# Patient Record
Sex: Male | Born: 1980 | Race: Black or African American | Hispanic: No | Marital: Married | Smoking: Current every day smoker
Health system: Southern US, Community
[De-identification: ages and names within clinical notes are randomized; demographics above are authoritative.]

---

## 2016-11-17 ENCOUNTER — Encounter (HOSPITAL_BASED_OUTPATIENT_CLINIC_OR_DEPARTMENT_OTHER): Payer: Self-pay | Admitting: *Deleted

## 2016-11-17 ENCOUNTER — Emergency Department (HOSPITAL_BASED_OUTPATIENT_CLINIC_OR_DEPARTMENT_OTHER)
Admission: EM | Admit: 2016-11-17 | Discharge: 2016-11-17 | Disposition: A | Discharge status: Home / Self Care | Payer: Self-pay | Attending: Emergency Medicine | Admitting: Emergency Medicine

## 2016-11-17 DIAGNOSIS — F17200 Nicotine dependence, unspecified, uncomplicated: Secondary | ICD-10-CM | POA: Insufficient documentation

## 2016-11-17 DIAGNOSIS — L0201 Cutaneous abscess of face: Secondary | ICD-10-CM | POA: Insufficient documentation

## 2016-11-17 MED ORDER — SULFAMETHOXAZOLE-TRIMETHOPRIM 800-160 MG PO TABS: 1.0000 | ORAL_TABLET | Freq: Two times a day (BID) | ORAL | 0 refills | Status: AC

## 2016-11-17 MED ORDER — SULFAMETHOXAZOLE-TRIMETHOPRIM 800-160 MG PO TABS
1.0000 | ORAL_TABLET | Freq: Once | ORAL | Status: AC
  Administered 2016-11-17: 1 via ORAL
  Filled 2016-11-17: qty 1

## 2016-11-17 MED ORDER — LIDOCAINE-EPINEPHRINE (PF) 2 %-1:200000 IJ SOLN
10.0000 mL | Freq: Once | INTRAMUSCULAR | Status: AC
  Administered 2016-11-17: 10 mL via INTRADERMAL
  Filled 2016-11-17: qty 10

## 2016-11-17 NOTE — ED Triage Notes (Signed)
Patient states he had a pimple over his left eyebrow and last night he attempted to squeeze it.  States he could not get the white head out so he placed a safety pin tip into the pimple.  Woke up this morning with redness and swelling around his left orbit, eye lid and forehead.

## 2016-11-17 NOTE — ED Provider Notes (Signed)
[facility] Provider Note   CSN: [id number] Arrival date & time: 11/17/16  1427   By signing my name below, I, Johnny Rivers, attest that this documentation has been prepared under the direction and in the presence of Johnny Russo, PA-C. Electronically Signed: Cynda AcresHailei Rivers, Scribe. 11/17/16. 5:18 PM.   History   Chief Complaint Chief Complaint  Patient presents with  . Facial Swelling    left   HPI Comments: Johnny Rivers is a 36 y.o. male with no significant past medical history, who presents to the Emergency Department complaining of sudden-onset, constant left eye swelling that began this morning. Patient reports having a "pimple" over his left eyebrow, Patient attempted to pop the pimple yesterday evening. Patient reports placing a safety pin tip into the pimple, causing the area to begin bleeding. Patient states this morning he woke up with his left eye and left eyebrow swollen. Patient reports associated left eyebrow pain. Patient reports taking benadryl and applying ice with no relief. Patient is a current everyday smoker. Patient denies any fever, chills, nausea, vomiting, sore throat, trouble swallowing, or lip or tongue swelling.   The history is provided by the patient. No language interpreter was used.    History reviewed. No pertinent past medical history.  There are no active problems to display for this patient.   History reviewed. No pertinent surgical history.     Home Medications    Prior to Admission medications   Medication Sig Start Date End Date Taking? Authorizing Provider  sulfamethoxazole-trimethoprim (BACTRIM DS,SEPTRA DS) 800-160 MG tablet Take 1 tablet by mouth 2 (two) times daily. 11/17/16 11/24/16  Rivers, Johnny N, PA-C    Family History No family history on file.  Social History Social History  Substance Use Topics  . Smoking status: Current Every Day Smoker  . Smokeless tobacco: Never Used  . Alcohol use No      Allergies   Patient has no known allergies.   Review of Systems Review of Systems  Constitutional: Negative for chills and fatigue.  HENT: Positive for facial swelling (left eye). Negative for sore throat and trouble swallowing.   Eyes: Negative for pain and visual disturbance.  Gastrointestinal: Negative for nausea and vomiting.     Physical Exam Updated Vital Signs BP (!) 134/92 (BP Location: Right Arm)   Pulse 78   Temp 98.7 F (37.1 C) (Oral)   Resp 18   Ht 6' (1.829 m)   Wt 160 lb (72.6 kg)   SpO2 100%   BMI 21.70 kg/m   Physical Exam  Constitutional: He appears well-developed and well-nourished. No distress.  HENT:  Head: Normocephalic and atraumatic.  Eyes: Conjunctivae and EOM are normal. Pupils are equal, round, and reactive to light.  Left upper eyelid with edema. Visual acuity normal.  Neck: Normal range of motion. Neck supple.  Cardiovascular: Normal rate.   Pulmonary/Chest: Effort normal. No stridor. No respiratory distress.  Lymphadenopathy:    He has no cervical adenopathy.  Skin:  Left lateral brow with small mildly fluctuant erythematous abscess with scab. No significant surrounding cellulitis.   Psychiatric: He has a normal mood and affect. His behavior is normal.  Nursing note and vitals reviewed.    ED Treatments / Results  DIAGNOSTIC STUDIES: Oxygen Saturation is 100% on RA, normal by my interpretation.    COORDINATION OF CARE: 5:15 PM Discussed treatment plan with pt at bedside and pt agreed to plan, which includes imaging of the eye.   Labs (all labs  ordered are listed, but only abnormal results are displayed) Labs Reviewed - No data to display  EKG  EKG Interpretation None       Radiology No results found.  Procedures .Marland KitchenIncision and Drainage Date/Time: 11/17/2016 5:42 PM Performed by: Rivers, [place] N Authorized by: Rivers, [place] N   Consent:    Consent obtained:  Verbal   Consent given by:  Patient   Risks  discussed:  Bleeding, incomplete drainage, infection, pain and damage to other organs   Alternatives discussed:  No treatment Location:    Type:  Abscess   Size:  ~1cm   Location:  Head   Head location:  Face Pre-procedure details:    Skin preparation:  Betadine Anesthesia (see MAR for exact dosages):    Anesthesia method:  Local infiltration   Local anesthetic:  Lidocaine 2% WITH epi Procedure type:    Complexity:  Simple Procedure details:    Needle aspiration: yes     Needle gauge: 27G.   Incision types:  Stab incision   Incision depth:  Dermal   Scalpel blade:  15   Wound management:  Probed and deloculated and irrigated with saline   Drainage:  Purulent and bloody   Drainage amount:  Moderate   Wound treatment:  Wound left open   Packing materials:  None Post-procedure details:    Patient tolerance of procedure:  Tolerated well, no immediate complications   EMERGENCY DEPARTMENT US SOFT TISSUE INTERPRETATION "Study: Limited Soft Tissue Ultrasound"  INDICATIONS: Soft tissue infection Multiple views of the body part were obtained in real-time with a multi-frequency linear probe  PERFORMED BY: Myself IMAGES ARCHIVED?: Yes SIDE:Left BODY PART:Left eyebrow INTERPRETATION:  Abcess present and No cellulitis noted     Medications Ordered in ED Medications  lidocaine-EPINEPHrine (XYLOCAINE W/EPI) 2 %-1:200000 (PF) injection 10 mL (not administered)     Initial Impression / Assessment and Plan / ED Course  I have reviewed the triage vital signs and the nursing notes.  Pertinent labs & imaging results that were available during my care of the patient were reviewed by me and considered in my medical decision making (see chart for details).     Patient with skin abscess left eyebrow, confirmed on U/S. Pt given option to drain it in ED or for conservative therapy with warm compresses and abx; pt elected I&D today.  Abscess was not large enough to warrant packing or  drain,  wound recheck in 2 days. No hx of immunocompromise. Pt is afebrile. Encouraged home warm soaks and flushing.  Mild signs of cellulitis in surrounding skin.  Will d/c to home with Bactrim. Pt safe for discharge.  Patient discussed with Dr. Rush Landmark Discussed results, findings, treatment and follow up. Patient advised of return precautions. Patient verbalized understanding and agreed with plan.    Final Clinical Impressions(s) / ED Diagnoses   Final diagnoses:  Cutaneous abscess of face    New Prescriptions New Prescriptions   SULFAMETHOXAZOLE-TRIMETHOPRIM (BACTRIM DS,SEPTRA DS) 800-160 MG TABLET    Take 1 tablet by mouth 2 (two) times daily.    I personally performed the services described in this documentation, which was scribed in my presence. The recorded information has been reviewed and is accurate.     Rivers, [place] N, PA-C 11/17/16 1836    Tegeler, Canary Brim, MD 11/18/16 [phone]

## 2016-11-17 NOTE — ED Notes (Signed)
ED Provider at bedside. 

## 2016-11-17 NOTE — Discharge Instructions (Signed)
Please read instructions below. Keep your wound clean and dry. Avoid touching it with your fingers. Take Bactrim, the antibiotic, 2 times per day until it's gone. You can take advil or tylenol as needed for pain. [facility], or this ER for a wound recheck in 2 days. Return to the ER for fever, or new or worsening symptoms.

## 2016-11-17 NOTE — ED Notes (Signed)
ED Provider at bedside for plan of care.

## 2020-06-14 ENCOUNTER — Encounter (HOSPITAL_BASED_OUTPATIENT_CLINIC_OR_DEPARTMENT_OTHER): Payer: Self-pay | Admitting: Emergency Medicine

## 2020-06-14 ENCOUNTER — Emergency Department (HOSPITAL_BASED_OUTPATIENT_CLINIC_OR_DEPARTMENT_OTHER)
Admission: EM | Admit: 2020-06-14 | Discharge: 2020-06-14 | Disposition: A | Discharge status: Home / Self Care | Payer: BLUE CROSS/BLUE SHIELD | Attending: Emergency Medicine | Admitting: Emergency Medicine

## 2020-06-14 ENCOUNTER — Other Ambulatory Visit: Payer: Self-pay

## 2020-06-14 DIAGNOSIS — F17200 Nicotine dependence, unspecified, uncomplicated: Secondary | ICD-10-CM | POA: Insufficient documentation

## 2020-06-14 DIAGNOSIS — R519 Headache, unspecified: Secondary | ICD-10-CM | POA: Insufficient documentation

## 2020-06-14 DIAGNOSIS — H53149 Visual discomfort, unspecified: Secondary | ICD-10-CM | POA: Diagnosis not present

## 2020-06-14 DIAGNOSIS — Z20822 Contact with and (suspected) exposure to covid-19: Secondary | ICD-10-CM | POA: Diagnosis not present

## 2020-06-14 LAB — RESP PANEL BY RT-PCR (FLU A&B, COVID) ARPGX2
Influenza A by PCR: NEGATIVE
Influenza B by PCR: NEGATIVE
SARS Coronavirus 2 by RT PCR: NEGATIVE

## 2020-06-14 MED ORDER — METOCLOPRAMIDE HCL 5 MG/ML IJ SOLN
10.0000 mg | Freq: Once | INTRAMUSCULAR | Status: AC
  Administered 2020-06-14: 16:00:00 10 mg via INTRAVENOUS
  Filled 2020-06-14: qty 2

## 2020-06-14 MED ORDER — KETOROLAC TROMETHAMINE 15 MG/ML IJ SOLN
15.0000 mg | Freq: Once | INTRAMUSCULAR | Status: AC
  Administered 2020-06-14: 16:00:00 15 mg via INTRAVENOUS
  Filled 2020-06-14: qty 1

## 2020-06-14 MED ORDER — SODIUM CHLORIDE 0.9 % IV BOLUS
500.0000 mL | Freq: Once | INTRAVENOUS | Status: AC
  Administered 2020-06-14: 16:00:00 500 mL via INTRAVENOUS

## 2020-06-14 NOTE — ED Provider Notes (Signed)
[facility] EMERGENCY DEPARTMENT Provider Note   CSN: [id number] Arrival date & time: 06/14/20  1358     History Chief Complaint  Patient presents with  . Headache    Johnny Rivers is a 39 y.o. male.  HPI   Patient with no significant medical history presents to the emergency department with chief complaint of headache.  Patient states headache started yesterday and has continued.  He states he has pain bilaterally on his forehead, describes as a consistent pain with sensitivity to noise and photophobia.  He denies change in vision, paresthesias or weakness in the upper or lower extremities, nausea, vomiting, recent head trauma and is not on anticoagulant.  He states he is fully up-to-date on his childhood vaccines, has no torticollis, denies fevers, chills, nasal congestion, sore throat, dental pain, general body aches.  He is currently not vaccine against COVID-19, denies recent sick contacts, no recent travels.  Does endorse that he recently got fitted for glasses and is unsure if he has to wear them consistently.  He has been taking ibuprofen without any relief has no history of migraines.  Patient denies chest pain, shortness of breath, abdominal pain, nausea, vomiting, diarrhea, pedal edema.  History reviewed. No pertinent past medical history.  There are no problems to display for this patient.   History reviewed. No pertinent surgical history.     No family history on file.  Social History   Tobacco Use  . Smoking status: Current Every Day Smoker  . Smokeless tobacco: Never Used  Substance Use Topics  . Alcohol use: No  . Drug use: No    Home Medications Prior to Admission medications   Not on File    Allergies    Patient has no known allergies.  Review of Systems   Review of Systems  Constitutional: Negative for chills and fever.  HENT: Negative for congestion and sore throat.   Eyes: Negative for visual disturbance.  Respiratory: Negative  for shortness of breath.   Cardiovascular: Negative for chest pain.  Gastrointestinal: Negative for abdominal pain, diarrhea, nausea and vomiting.  Genitourinary: Negative for enuresis.  Musculoskeletal: Negative for back pain.  Skin: Negative for rash.  Neurological: Positive for headaches. Negative for dizziness.  Hematological: Does not bruise/bleed easily.    Physical Exam Updated Vital Signs BP 94/60 (BP Location: Left Arm)   Pulse 78   Temp 99 F (37.2 C) (Oral)   Resp 16   Ht 5\' 11"  (1.803 m)   Wt 78 kg   SpO2 100%   BMI 23.99 kg/m   Physical Exam Vitals and nursing note reviewed.  Constitutional:      General: He is not in acute distress.    Appearance: Normal appearance. He is not ill-appearing or diaphoretic.  HENT:     Head: Normocephalic and atraumatic.     Right Ear: Tympanic membrane, ear canal and external ear normal.     Left Ear: Tympanic membrane, ear canal and external ear normal.     Nose: No congestion or rhinorrhea.     Comments: No nasal congestion, turbinates were nonerythematous    Mouth/Throat:     Mouth: Mucous membranes are moist.     Pharynx: Oropharynx is clear. No oropharyngeal exudate or posterior oropharyngeal erythema.     Comments: Oropharynx is visualized tongue and uvula are both midline, no dental cavities noted. Eyes:     General: No visual field deficit or scleral icterus.    Extraocular Movements: Extraocular movements intact.  Conjunctiva/sclera: Conjunctivae normal.     Pupils: Pupils are equal, round, and reactive to light.  Cardiovascular:     Rate and Rhythm: Normal rate and regular rhythm.     Pulses: Normal pulses.     Heart sounds: No murmur heard. No friction rub. No gallop.   Pulmonary:     Effort: Pulmonary effort is normal. No respiratory distress.     Breath sounds: No wheezing, rhonchi or rales.  Abdominal:     Palpations: Abdomen is soft.     Tenderness: There is no abdominal tenderness.  Musculoskeletal:         General: No swelling or tenderness.  Skin:    General: Skin is warm and dry.  Neurological:     General: No focal deficit present.     Mental Status: He is alert.     GCS: GCS eye subscore is 4. GCS verbal subscore is 5. GCS motor subscore is 6.     Cranial Nerves: Cranial nerves are intact. No cranial nerve deficit or facial asymmetry.     Sensory: Sensation is intact. No sensory deficit.     Motor: Motor function is intact. No weakness or pronator drift.     Coordination: Coordination is intact. Romberg sign negative. Finger-Nose-Finger Test normal.  Psychiatric:        Mood and Affect: Mood normal.     ED Results / Procedures / Treatments   Labs (all labs ordered are listed, but only abnormal results are displayed) Labs Reviewed  RESP PANEL BY RT-PCR (FLU A&B, COVID) ARPGX2    EKG None  Radiology No results found.  Procedures Procedures (including critical care time)  Medications Ordered in ED Medications  ketorolac (TORADOL) 15 MG/ML injection 15 mg (15 mg Intravenous Given 06/14/20 1611)  metoCLOPramide (REGLAN) injection 10 mg (10 mg Intravenous Given 06/14/20 1612)  sodium chloride 0.9 % bolus 500 mL (0 mLs Intravenous Stopped 06/14/20 1701)    ED Course  I have reviewed the triage vital signs and the nursing notes.  Pertinent labs & imaging results that were available during my care of the patient were reviewed by me and considered in my medical decision making (see chart for details).    MDM Rules/Calculators/A&P                          Patient presents with headache.  He is alert, does not appear in acute distress, vital signs reassuring.  Due to well-appearing patient, benign physical exam further lab work and imaging not warranted at this time.  Will provide patient with a migraine cocktail reevaluate.  Patient was reassessed at providing cocktail and he is feeling much better, states his headache is much more manageable, has no other complaints  at this time.  Respiratory panel negative for Covid, influenza A/B.  Low suspicion for intracranial head bleed or mass as there is no neuro deficits on my exam, patient denies recent head trauma, is not on anticoagulant.  Headache improved with migraine cocktail.  Low suspicion for dental abnormality causing headaches as oropharynx was visualized no cavities noted.  Patient had negative meningeal sign. low suspicion for systemic infection as patient is nontoxic-appearing, vital signs reassuring, no obvious source infection noted on exam.  Low suspicion for pneumonia as lung sounds are clear bilaterally, will defer imaging at this time. low suspicion for strep throat as oropharynx was visualized, no erythema or exudates noted.  Low suspicion patient would need  hospitalized  due to viral infection or Covid as vital signs reassuring, patient is not in respiratory distress.  Patient has a headache unknown etiology differential diagnosis includes migraine versus URI versus dehydration versus stress.  Will recommend over-the-counter pain medications follow-up with PCP for further evaluation.  Vital signs have remained stable, no indication for hospital admission.Patient given at home care as well strict return precautions.  Patient verbalized that they understood agreed to said plan.   Final Clinical Impression(s) / ED Diagnoses Final diagnoses:  Bad headache    Rx / DC Orders ED Discharge Orders    None       Carroll Sage, PA-C 06/14/20 1809    Vanetta Mulders, MD 06/19/20 [phone]

## 2020-06-14 NOTE — Discharge Instructions (Signed)
Seen here for a headache.  Exam looks reassuring.  I recommend over-the-counter pain medications like ibuprofen and/or Tylenol every 6 hours as needed please follow dosage and on the back of bottle.  You may also take Excedrin and Benadryl as this can help with migraines as well.  Your Covid test is pending at this time, you should see results on MyChart.   Recommend following up with your PCP for further evaluation.  Come back to the emergency department if you develop chest pain, shortness of breath, severe abdominal pain, uncontrolled nausea, vomiting, diarrhea.

## 2020-06-14 NOTE — ED Triage Notes (Addendum)
Frontal headache x 2 days. Pt recently got new glasses but is unsure if he is supposed to wear them all of the time.

## 2020-07-01 ENCOUNTER — Encounter (HOSPITAL_BASED_OUTPATIENT_CLINIC_OR_DEPARTMENT_OTHER): Payer: Self-pay

## 2020-07-01 ENCOUNTER — Emergency Department (HOSPITAL_BASED_OUTPATIENT_CLINIC_OR_DEPARTMENT_OTHER)
Admission: EM | Admit: 2020-07-01 | Discharge: 2020-07-01 | Disposition: A | Discharge status: Home / Self Care | Payer: BLUE CROSS/BLUE SHIELD | Attending: Emergency Medicine | Admitting: Emergency Medicine

## 2020-07-01 ENCOUNTER — Emergency Department (HOSPITAL_BASED_OUTPATIENT_CLINIC_OR_DEPARTMENT_OTHER)
Admission: EM | Admit: 2020-07-01 | Discharge: 2020-07-01 | Disposition: A | Discharge status: Home / Self Care | Payer: BLUE CROSS/BLUE SHIELD

## 2020-07-01 ENCOUNTER — Other Ambulatory Visit: Payer: Self-pay

## 2020-07-01 DIAGNOSIS — Z5321 Procedure and treatment not carried out due to patient leaving prior to being seen by health care provider: Secondary | ICD-10-CM | POA: Diagnosis not present

## 2020-07-01 DIAGNOSIS — R300 Dysuria: Secondary | ICD-10-CM | POA: Insufficient documentation

## 2020-07-01 DIAGNOSIS — R369 Urethral discharge, unspecified: Secondary | ICD-10-CM | POA: Insufficient documentation

## 2020-07-01 LAB — URINALYSIS, ROUTINE W REFLEX MICROSCOPIC
Bilirubin Urine: NEGATIVE
Glucose, UA: NEGATIVE mg/dL
Ketones, ur: NEGATIVE mg/dL
Nitrite: NEGATIVE
Protein, ur: NEGATIVE mg/dL
Specific Gravity, Urine: 1.02 (ref 1.005–1.030)
pH: 6.5 (ref 5.0–8.0)

## 2020-07-01 LAB — URINALYSIS, MICROSCOPIC (REFLEX): WBC, UA: >50 WBC/hpf (ref 0–5)

## 2020-07-01 NOTE — ED Notes (Signed)
Pt left per  screener

## 2020-07-01 NOTE — ED Notes (Signed)
Pt called x 3 no answer 

## 2020-07-01 NOTE — ED Triage Notes (Signed)
Pt c/o dysuria x 2 days-also c/o penile d/c-NAD-steady gait

## 2020-07-02 ENCOUNTER — Other Ambulatory Visit: Payer: Self-pay

## 2020-07-02 ENCOUNTER — Telehealth (HOSPITAL_COMMUNITY): Payer: Self-pay

## 2020-07-02 ENCOUNTER — Emergency Department (HOSPITAL_BASED_OUTPATIENT_CLINIC_OR_DEPARTMENT_OTHER)
Admission: EM | Admit: 2020-07-02 | Discharge: 2020-07-02 | Disposition: A | Discharge status: Home / Self Care | Payer: BLUE CROSS/BLUE SHIELD | Attending: Emergency Medicine | Admitting: Emergency Medicine

## 2020-07-02 ENCOUNTER — Encounter (HOSPITAL_BASED_OUTPATIENT_CLINIC_OR_DEPARTMENT_OTHER): Payer: Self-pay | Admitting: Emergency Medicine

## 2020-07-02 DIAGNOSIS — F17200 Nicotine dependence, unspecified, uncomplicated: Secondary | ICD-10-CM | POA: Insufficient documentation

## 2020-07-02 DIAGNOSIS — R300 Dysuria: Secondary | ICD-10-CM | POA: Diagnosis present

## 2020-07-02 DIAGNOSIS — N342 Other urethritis: Secondary | ICD-10-CM | POA: Diagnosis not present

## 2020-07-02 LAB — HIV ANTIBODY (ROUTINE TESTING W REFLEX): HIV Screen 4th Generation wRfx: NONREACTIVE

## 2020-07-02 LAB — RPR: RPR Ser Ql: NONREACTIVE

## 2020-07-02 MED ORDER — DOXYCYCLINE HYCLATE 100 MG PO CAPS: 100.0000 mg | ORAL_CAPSULE | Freq: Two times a day (BID) | ORAL | 0 refills | Status: AC

## 2020-07-02 MED ORDER — DOXYCYCLINE HYCLATE 100 MG PO TABS
100.0000 mg | ORAL_TABLET | Freq: Once | ORAL | Status: AC
  Administered 2020-07-02: 06:00:00 100 mg via ORAL
  Filled 2020-07-02: qty 1

## 2020-07-02 MED ORDER — CEFTRIAXONE SODIUM 500 MG IJ SOLR
500.0000 mg | Freq: Once | INTRAMUSCULAR | Status: AC
  Administered 2020-07-02: 06:00:00 500 mg via INTRAMUSCULAR
  Filled 2020-07-02: qty 500

## 2020-07-02 NOTE — Discharge Instructions (Addendum)
Do not have sex until you have completed the course of antibiotics.  Make sure that all of your sexual partners have been treated.  Do not have sex with any of them until they have completed their course of antibiotics.

## 2020-07-02 NOTE — ED Provider Notes (Signed)
[facility] EMERGENCY DEPARTMENT Provider Note   CSN: [id number] Arrival date & time: 07/02/20  0118   History Chief Complaint  Patient presents with   Dysuria    Johnny Rivers is a 39 y.o. male.  The history is provided by the patient.  Dysuria Presenting symptoms: dysuria   He comes in with 2-day history of funny feeling when he urinates.  He states it is not exactly painful but just does not feel right.  He has noted a slight discharge.  He does admit to having had unprotected sex.  Denies abdominal pain or flank pain.  He denies fever or chills.  He denies nausea, vomiting.  History reviewed. No pertinent past medical history.  There are no problems to display for this patient.   History reviewed. No pertinent surgical history.     History reviewed. No pertinent family history.  Social History   Tobacco Use   Smoking status: Current Every Day Smoker   Smokeless tobacco: Never Used  Substance Use Topics   Alcohol use: No   Drug use: No    Home Medications Prior to Admission medications   Not on File    Allergies    Patient has no known allergies.  Review of Systems   Review of Systems  Genitourinary: Positive for dysuria.  All other systems reviewed and are negative.   Physical Exam Updated Vital Signs BP 137/87 (BP Location: Left Arm)    Pulse 70    Temp 98.2 F (36.8 C) (Oral)    Resp 16    Ht 5\' 11"  (1.803 m)    Wt 78 kg    SpO2 100%    BMI 23.98 kg/m   Physical Exam Vitals and nursing note reviewed.   39 year old male, resting comfortably and in no acute distress. Vital signs are normal. Oxygen saturation is 100%, which is normal. Head is normocephalic and atraumatic. PERRLA, EOMI. Oropharynx is clear. Neck is nontender and supple without adenopathy or JVD. Back is nontender and there is no CVA tenderness. Lungs are clear without rales, wheezes, or rhonchi. Chest is nontender. Heart has regular rate and rhythm without  murmur. Abdomen is soft, flat, nontender without masses or hepatosplenomegaly and peristalsis is normoactive. Genitalia: Uncircumcised penis.  Small amount of urethral discharge present.  Testes descended without masses.  Shotty inguinal adenopathy present bilaterally. Extremities have no cyanosis or edema, full range of motion is present. Skin is warm and dry without rash. Neurologic: Mental status is normal, cranial nerves are intact, there are no motor or sensory deficits.  ED Results / Procedures / Treatments   Labs (all labs ordered are listed, but only abnormal results are displayed) Labs Reviewed  RPR  HIV ANTIBODY (ROUTINE TESTING W REFLEX)  GC/CHLAMYDIA PROBE AMP ([place]) NOT AT [facility]   Procedures Procedures   Medications Ordered in ED Medications  cefTRIAXone (ROCEPHIN) injection 500 mg (has no administration in time range)  doxycycline (VIBRA-TABS) tablet 100 mg (has no administration in time range)    ED Course  I have reviewed the triage vital signs and the nursing notes.  Pertinent lab results that were available during my care of the patient were reviewed by me and considered in my medical decision making (see chart for details).  MDM Rules/Calculators/A&P Urethritis, presumed STI.  Specimens are sent for GC and chlamydia PCR, RPR, HIV testing.  He had been in the ED earlier and left without being seen.  Urinalysis at that time did show  greater than 50 WBCs which is felt to be representing urethritis and not urinary tract infection.  Old records were reviewed, and he does have a prior ED visit for urethritis.  Final Clinical Impression(s) / ED Diagnoses Final diagnoses:  Urethritis    Rx / DC Orders ED Discharge Orders         Ordered    doxycycline (VIBRAMYCIN) 100 MG capsule  2 times daily        07/02/20 4888           Dione Booze, MD 07/02/20 [phone]

## 2020-07-02 NOTE — ED Triage Notes (Signed)
Patient complains of dysuria and penile dc x 2-3 days

## 2020-07-05 LAB — GC/CHLAMYDIA PROBE AMP (~~LOC~~) NOT AT ARMC
Chlamydia: POSITIVE — AB
Comment: NEGATIVE
Comment: NORMAL
Neisseria Gonorrhea: POSITIVE — AB

## 2020-07-30 ENCOUNTER — Emergency Department (HOSPITAL_BASED_OUTPATIENT_CLINIC_OR_DEPARTMENT_OTHER)
Admission: EM | Admit: 2020-07-30 | Discharge: 2020-07-30 | Disposition: A | Discharge status: Home / Self Care | Payer: BLUE CROSS/BLUE SHIELD | Attending: Emergency Medicine | Admitting: Emergency Medicine

## 2020-07-30 ENCOUNTER — Encounter (HOSPITAL_BASED_OUTPATIENT_CLINIC_OR_DEPARTMENT_OTHER): Payer: Self-pay

## 2020-07-30 ENCOUNTER — Other Ambulatory Visit: Payer: Self-pay

## 2020-07-30 DIAGNOSIS — Z113 Encounter for screening for infections with a predominantly sexual mode of transmission: Secondary | ICD-10-CM | POA: Diagnosis not present

## 2020-07-30 DIAGNOSIS — N50819 Testicular pain, unspecified: Secondary | ICD-10-CM | POA: Diagnosis present

## 2020-07-30 DIAGNOSIS — F17200 Nicotine dependence, unspecified, uncomplicated: Secondary | ICD-10-CM | POA: Insufficient documentation

## 2020-07-30 LAB — URINALYSIS, MICROSCOPIC (REFLEX)

## 2020-07-30 LAB — URINALYSIS, ROUTINE W REFLEX MICROSCOPIC
Bilirubin Urine: NEGATIVE
Glucose, UA: NEGATIVE mg/dL
Ketones, ur: NEGATIVE mg/dL
Leukocytes,Ua: NEGATIVE
Nitrite: NEGATIVE
Protein, ur: NEGATIVE mg/dL
Specific Gravity, Urine: 1.025 (ref 1.005–1.030)
pH: 6 (ref 5.0–8.0)

## 2020-07-30 NOTE — ED Provider Notes (Signed)
[facility] EMERGENCY DEPARTMENT Provider Note   CSN: [id number] Arrival date & time: 07/30/20  1017     History No chief complaint on file.   Johnny Rivers is a 40 y.o. male.  HPI   Patient with no significant medical history presents to the emergency department with chief complaint of STD check.  Patient endorses last week in December he came to emergency department for an STD, he was diagnosed with chlamydia,  was treated with ceftriaxone and doxycycline, he completed the full course of antibiotics and abstain from sexual intercourse.  He returned back here today because he has some intermediate testicular pain and wants to be retested for gonorrhea and chlamydia.  He has no other symptoms at this time, he denies urinary urgency, frequency, dysuria, hematuria, penile discharge.  Patient has no other complaints at this time, denies any alleviating factors.  Patient denies headaches, fevers, chills, shortness breath, chest pain, abdominal pain, nausea, vomiting, diarrhea, pedal edema.  History reviewed. No pertinent past medical history.  There are no problems to display for this patient.   History reviewed. No pertinent surgical history.     History reviewed. No pertinent family history.  Social History   Tobacco Use  . Smoking status: Current Every Day Smoker  . Smokeless tobacco: Never Used  Substance Use Topics  . Alcohol use: No  . Drug use: No    Home Medications Prior to Admission medications   Medication Sig Start Date End Date Taking? Authorizing Provider  doxycycline (VIBRAMYCIN) 100 MG capsule Take 1 capsule (100 mg total) by mouth 2 (two) times daily. 07/02/20   Dione Booze, MD    Allergies    Patient has no known allergies.  Review of Systems   Review of Systems  Constitutional: Negative for chills and fever.  HENT: Negative for congestion.   Respiratory: Negative for shortness of breath.   Cardiovascular: Negative for chest pain.   Gastrointestinal: Negative for abdominal pain, diarrhea, nausea and rectal pain.  Genitourinary: Positive for testicular pain. Negative for enuresis, hematuria, penile discharge, penile swelling and scrotal swelling.  Musculoskeletal: Negative for back pain.  Skin: Negative for rash.  Neurological: Negative for dizziness.  Hematological: Does not bruise/bleed easily.    Physical Exam Updated Vital Signs BP 123/89 (BP Location: Right Arm)   Pulse 69   Temp 98.4 F (36.9 C) (Oral)   Resp 16   Ht 5\' 11"  (1.803 m)   Wt 77.1 kg   SpO2 100%   BMI 23.71 kg/m   Physical Exam Vitals and nursing note reviewed. Exam conducted with a chaperone present.  Constitutional:      General: He is not in acute distress.    Appearance: He is not ill-appearing.  HENT:     Head: Normocephalic and atraumatic.     Nose: No congestion.  Eyes:     Conjunctiva/sclera: Conjunctivae normal.  Cardiovascular:     Rate and Rhythm: Normal rate.  Pulmonary:     Effort: Pulmonary effort is normal.  Genitourinary:    Penis: Normal.      Testes: Normal.     Comments: With chaperone present genital exam was performed, there is no rashes, abrasions, penile discharge or other gross otherwise noted.  Testicles were palpated they are nontender to palpation.  Musculoskeletal:     Comments: Patient is moving all 4 extremities at difficulty.  Skin:    General: Skin is warm and dry.  Neurological:     Mental Status: He is  alert.  Psychiatric:        Mood and Affect: Mood normal.     ED Results / Procedures / Treatments   Labs (all labs ordered are listed, but only abnormal results are displayed) Labs Reviewed  URINALYSIS, ROUTINE W REFLEX MICROSCOPIC - Abnormal; Notable for the following components:      Result Value   Hgb urine dipstick SMALL (*)    All other components within normal limits  URINALYSIS, MICROSCOPIC (REFLEX) - Abnormal; Notable for the following components:   Bacteria, UA RARE (*)     All other components within normal limits  GC/CHLAMYDIA PROBE AMP ([place]) NOT AT [facility]    EKG None  Radiology No results found.  Procedures Procedures   Medications Ordered in ED Medications - No data to display  ED Course  I have reviewed the triage vital signs and the nursing notes.  Pertinent labs & imaging results that were available during my care of the patient were reviewed by me and considered in my medical decision making (see chart for details).    MDM Rules/Calculators/A&P                          Patient presents with complaint of intermittent testicular pain and requests repeat STD check.  He is alert, does not appear in acute distress, vital signs reassuring.  Will obtain UA and repeat gonorrhea chlamydia test.  Urine negative for nitrates, leukocytes no hematuria rare bacteria present.  Low suspicion for pyelonephritis or UTI as patient denies urinary symptoms, lower back pain, nausea vomiting fevers or chills, urine negative for infection.  Low suspicion for epididymitis or prostatitis as patient denies saddle pain, testicles are nontender and stable patient.  Low suspicion for herpes as there is no noted rash on patient's genitalia.  Low suspicion for STD as patient denies penile discharge, consistent testicular pain, or rash around the genitalia, testicles were nontender.  Low suspicion for systemic infection as patient is nontoxic-appearing, vital signs reassuring, no obvious source infection noted on exam.  Will defer STD treatment at this time as patient was recently treated for gonorrhea and chlamydia 2 weeks ago, has no symptoms today.  Will recommend safe sex practices, follow-up with health department for further evaluation.  Vital signs have remained stable, no indication for hospital admission.  Patient discussed with attending and they agreed with assessment and plan.  Patient given at home care as well strict return precautions.  Patient verbalized  that they understood agreed to said plan.  Final Clinical Impression(s) / ED Diagnoses Final diagnoses:  Screening examination for STD (sexually transmitted disease)    Rx / DC Orders ED Discharge Orders    None       Carroll Sage, PA-C 07/30/20 1417    Alvira Monday, MD 08/02/20 1104

## 2020-07-30 NOTE — ED Triage Notes (Signed)
Pt seen on 07/02/20 for STD and treated. Pt reports that he had sex post treatment and needs to be re-treated as his wife is being treated today for the same.

## 2020-07-30 NOTE — ED Notes (Signed)
Chaperone for [employer]

## 2020-07-30 NOTE — Discharge Instructions (Addendum)
Even seen here for recheck of your STDs.  Your lab work will become available on your MyChart with the next 24 to 48 hours.  I recommend safe sex practices as this will prevent future STD exposures.  You may follow-up with the health department for further evaluation as they provide free screening and treatment for STIs.  Come back to the emergency department if you develop chest pain, shortness of breath, severe abdominal pain, uncontrolled nausea, vomiting, diarrhea.

## 2020-07-31 LAB — GC/CHLAMYDIA PROBE AMP (~~LOC~~) NOT AT ARMC
Chlamydia: NEGATIVE
Comment: NEGATIVE
Comment: NORMAL
Neisseria Gonorrhea: NEGATIVE

## 2021-05-18 ENCOUNTER — Emergency Department (HOSPITAL_BASED_OUTPATIENT_CLINIC_OR_DEPARTMENT_OTHER): Payer: BLUE CROSS/BLUE SHIELD

## 2021-05-18 ENCOUNTER — Other Ambulatory Visit: Payer: Self-pay

## 2021-05-18 ENCOUNTER — Emergency Department (HOSPITAL_BASED_OUTPATIENT_CLINIC_OR_DEPARTMENT_OTHER)
Admission: EM | Admit: 2021-05-18 | Discharge: 2021-05-18 | Disposition: A | Discharge status: Home / Self Care | Payer: BLUE CROSS/BLUE SHIELD | Attending: Emergency Medicine | Admitting: Emergency Medicine

## 2021-05-18 ENCOUNTER — Encounter (HOSPITAL_BASED_OUTPATIENT_CLINIC_OR_DEPARTMENT_OTHER): Payer: Self-pay | Admitting: *Deleted

## 2021-05-18 DIAGNOSIS — F17200 Nicotine dependence, unspecified, uncomplicated: Secondary | ICD-10-CM | POA: Diagnosis not present

## 2021-05-18 DIAGNOSIS — R079 Chest pain, unspecified: Secondary | ICD-10-CM

## 2021-05-18 LAB — COMPREHENSIVE METABOLIC PANEL
ALT: 15 U/L (ref 0–44)
AST: 27 U/L (ref 15–41)
Albumin: 4.7 g/dL (ref 3.5–5.0)
Alkaline Phosphatase: 44 U/L (ref 38–126)
Anion gap: 10 (ref 5–15)
BUN: 15 mg/dL (ref 6–20)
CO2: 25 mmol/L (ref 22–32)
Calcium: 9.2 mg/dL (ref 8.9–10.3)
Chloride: 99 mmol/L (ref 98–111)
Creatinine, Ser: 1.09 mg/dL (ref 0.61–1.24)
GFR, Estimated: >60 mL/min (ref >60)
Glucose, Bld: 123 mg/dL — ABNORMAL HIGH (ref 70–99)
Potassium: 3.6 mmol/L (ref 3.5–5.1)
Sodium: 134 mmol/L — ABNORMAL LOW (ref 135–145)
Total Bilirubin: 0.6 mg/dL (ref 0.3–1.2)
Total Protein: 7.8 g/dL (ref 6.5–8.1)

## 2021-05-18 LAB — CBC WITH DIFFERENTIAL/PLATELET
Abs Immature Granulocytes: 0.02 10*3/uL (ref 0.00–0.07)
Basophils Absolute: 0.1 10*3/uL (ref 0.0–0.1)
Basophils Relative: 1 %
Eosinophils Absolute: 0.2 10*3/uL (ref 0.0–0.5)
Eosinophils Relative: 4 %
HCT: 43.7 % (ref 39.0–52.0)
Hemoglobin: 14.9 g/dL (ref 13.0–17.0)
Immature Granulocytes: 0 %
Lymphocytes Relative: 44 %
Lymphs Abs: 2.7 10*3/uL (ref 0.7–4.0)
MCH: 31.8 pg (ref 26.0–34.0)
MCHC: 34.1 g/dL (ref 30.0–36.0)
MCV: 93.2 fL (ref 80.0–100.0)
Monocytes Absolute: 0.7 10*3/uL (ref 0.1–1.0)
Monocytes Relative: 12 %
Neutro Abs: 2.4 10*3/uL (ref 1.7–7.7)
Neutrophils Relative %: 39 %
Platelets: 211 10*3/uL (ref 150–400)
RBC: 4.69 MIL/uL (ref 4.22–5.81)
RDW: 12.9 % (ref 11.5–15.5)
WBC: 6 10*3/uL (ref 4.0–10.5)
nRBC: 0 % (ref 0.0–0.2)

## 2021-05-18 LAB — TROPONIN I (HIGH SENSITIVITY): Troponin I (High Sensitivity): 3 ng/L (ref <18)

## 2021-05-18 NOTE — ED Provider Notes (Signed)
[facility] EMERGENCY DEPARTMENT Provider Note   CSN: [id number] Arrival date & time: 05/18/21  1900     History No chief complaint on file.   Johnny Rivers is a 40 y.o. male.  HPI     Chest pain with radiation to the left arm Began a day or two ago Coming and going pain Smoking cigarettes, feels like it is worse with smoking Not exertional, not positional. Happens more randomly coming and going, lasts for a few.  Not tightness, not pressure, like a dull ache. Had one before coming in. 4/10. No dyspnea, nausea, diaphoresis No leg pain or swelling No known medical history, has PCP No family hx of early heart disease No long trips car/airplane/recent surgeries/hx of PE Etoh soc every 2 days, no other drugs   History reviewed. No pertinent past medical history.  There are no problems to display for this patient.   History reviewed. No pertinent surgical history.     No family history on file.  Social History   Tobacco Use   Smoking status: Every Day   Smokeless tobacco: Never  Substance Use Topics   Alcohol use: Yes   Drug use: Yes    Types: Marijuana    Home Medications Prior to Admission medications   Medication Sig Start Date End Date Taking? Authorizing Provider  doxycycline (VIBRAMYCIN) 100 MG capsule Take 1 capsule (100 mg total) by mouth 2 (two) times daily. 07/02/20   Dione Booze, MD    Allergies    Patient has no known allergies.  Review of Systems   Review of Systems  Constitutional:  Negative for appetite change, chills and fever.  HENT:  Negative for congestion and sore throat.   Respiratory:  Negative for cough and shortness of breath.   Cardiovascular:  Positive for chest pain. Negative for leg swelling.  Gastrointestinal:  Negative for abdominal pain, constipation, diarrhea, nausea and vomiting.  Genitourinary:  Negative for dysuria.  Musculoskeletal:  Negative for back pain.  Skin:  Negative for rash.  Neurological:   Negative for syncope and light-headedness.   Physical Exam Updated Vital Signs BP 132/82   Pulse 85   Temp 98.4 F (36.9 C) (Oral)   Resp 17   Ht 5\' 11"  (1.803 m)   Wt 77.1 kg   SpO2 98%   BMI 23.71 kg/m   Physical Exam Vitals and nursing note reviewed.  Constitutional:      General: He is not in acute distress.    Appearance: He is well-developed. He is not diaphoretic.  HENT:     Head: Normocephalic and atraumatic.  Eyes:     Conjunctiva/sclera: Conjunctivae normal.  Cardiovascular:     Rate and Rhythm: Normal rate and regular rhythm.     Heart sounds: Normal heart sounds. No murmur heard.   No friction rub. No gallop.  Pulmonary:     Effort: Pulmonary effort is normal. No respiratory distress.     Breath sounds: Normal breath sounds. No wheezing or rales.  Abdominal:     General: There is no distension.     Palpations: Abdomen is soft.     Tenderness: There is no abdominal tenderness. There is no guarding.  Musculoskeletal:     Cervical back: Normal range of motion.  Skin:    General: Skin is warm and dry.  Neurological:     Mental Status: He is alert and oriented to person, place, and time.    ED Results / Procedures / Treatments  Labs (all labs ordered are listed, but only abnormal results are displayed) Labs Reviewed  COMPREHENSIVE METABOLIC PANEL - Abnormal; Notable for the following components:      Result Value   Sodium 134 (*)    Glucose, Bld 123 (*)    All other components within normal limits  CBC WITH DIFFERENTIAL/PLATELET  TROPONIN I (HIGH SENSITIVITY)    EKG EKG Interpretation  Date/Time:  Tuesday May 18 2021 19:15:07 EST Ventricular Rate:  78 PR Interval:  134 QRS Duration: 88 QT Interval:  420 QTC Calculation: 478 R Axis:   87 Text Interpretation: Normal sinus rhythm Normal ECG No previous ECGs available Confirmed by Gareth Morgan [phone]) on 05/18/2021 7:24:43 PM  Radiology DG Chest 2 View  Result Date:  05/18/2021 CLINICAL DATA:  Chest pain. EXAM: CHEST - 2 VIEW COMPARISON:  None. FINDINGS: The heart size and mediastinal contours are within normal limits. Both lungs are clear. The visualized skeletal structures are unremarkable. IMPRESSION: No active cardiopulmonary disease. Electronically Signed   By: Virgina Norfolk M.D.   On: 05/18/2021 19:43    Procedures Procedures   Medications Ordered in ED Medications - No data to display  ED Course  I have reviewed the triage vital signs and the nursing notes.  Pertinent labs & imaging results that were available during my care of the patient were reviewed by me and considered in my medical decision making (see chart for details).    MDM Rules/Calculators/A&P                           40yo male with history of smoking presents with concern for chest pain. Differential diagnosis for chest pain includes pulmonary embolus, dissection, pneumothorax, pneumonia, ACS, myocarditis, pericarditis.  EKG was done and evaluate by me and showed no acute ST changes and no signs of pericarditis. Chest x-ray was done and evaluated by me and radiology and showed no sign of pneumonia or pneumothorax. Patient is PERC negative and low risk [place] and have low suspicion for PE.  Patient is low risk HEART score and troponin negative after days of symptoms and have low suspicion for ACS.  Do not feel history or exam are consistent with aortic dissection. Recommend follow up with PCP and Cardiology. Patient discharged in stable condition with understanding of reasons to return.   Final Clinical Impression(s) / ED Diagnoses Final diagnoses:  Chest pain, unspecified type    Rx / DC Orders ED Discharge Orders     None        Gareth Morgan, MD 05/19/21 2245

## 2021-05-18 NOTE — ED Notes (Signed)
ED Provider at bedside. 

## 2021-05-18 NOTE — ED Notes (Signed)
Pt. Reports being here due to chest pain for the past 2 days but feels today he needed the pain checked.  Pt. Has no shortness of breath and no diaphoresis noted.

## 2021-05-18 NOTE — ED Triage Notes (Signed)
Mild discomfort in his left chest and left arm x 2 days. Denies cough.

## 2021-12-20 ENCOUNTER — Emergency Department (HOSPITAL_BASED_OUTPATIENT_CLINIC_OR_DEPARTMENT_OTHER): Payer: Self-pay

## 2021-12-20 ENCOUNTER — Other Ambulatory Visit: Payer: Self-pay

## 2021-12-20 ENCOUNTER — Encounter (HOSPITAL_BASED_OUTPATIENT_CLINIC_OR_DEPARTMENT_OTHER): Payer: Self-pay

## 2021-12-20 ENCOUNTER — Emergency Department (HOSPITAL_BASED_OUTPATIENT_CLINIC_OR_DEPARTMENT_OTHER)
Admission: EM | Admit: 2021-12-20 | Discharge: 2021-12-20 | Disposition: A | Discharge status: Home / Self Care | Payer: Self-pay | Attending: Emergency Medicine | Admitting: Emergency Medicine

## 2021-12-20 DIAGNOSIS — S92351A Displaced fracture of fifth metatarsal bone, right foot, initial encounter for closed fracture: Secondary | ICD-10-CM | POA: Insufficient documentation

## 2021-12-20 DIAGNOSIS — W500XXA Accidental hit or strike by another person, initial encounter: Secondary | ICD-10-CM | POA: Insufficient documentation

## 2021-12-20 DIAGNOSIS — Y9367 Activity, basketball: Secondary | ICD-10-CM | POA: Insufficient documentation

## 2021-12-20 MED ORDER — DICLOFENAC SODIUM ER 100 MG PO TB24
100.0000 mg | ORAL_TABLET | Freq: Every day | ORAL | 0 refills | Status: AC
Start: 2021-12-20 — End: ?

## 2021-12-20 MED ORDER — KETOROLAC TROMETHAMINE 60 MG/2ML IM SOLN
60.0000 mg | Freq: Once | INTRAMUSCULAR | Status: AC
  Administered 2021-12-20: 60 mg via INTRAMUSCULAR
  Filled 2021-12-20: qty 2

## 2021-12-20 NOTE — ED Triage Notes (Signed)
Pt to ED by POV from home with c/o R foot pain following an incident where he rolled his right ankle and foot playing basketball. Swelling noted to affected foot, PSM intact. Arrives A+O, VSS.

## 2021-12-20 NOTE — ED Provider Notes (Signed)
[facility] [place] [facility] Provider Note   CSN: [id number] Arrival date & time: 12/20/21  0127     History  Chief Complaint  Patient presents with   Foot Injury    Johnny Rivers is a 41 y.o. male.  The history is provided by the patient.  Foot Injury Location:  Foot Time since incident: earlier this evening at a father's day game. Injury: yes   Mechanism of injury comment:  Another player stepped on his right foot during basketball Foot location:  R foot Pain details:    Quality:  Aching   Radiates to: right ankle.   Severity:  Severe   Onset quality:  Sudden   Timing:  Constant   Progression:  Unchanged Chronicity:  New Foreign body present:  No foreign bodies Prior injury to area:  No Relieved by:  Nothing Worsened by:  Nothing Ineffective treatments: a goody powder. Associated symptoms: no fever   Risk factors: no concern for non-accidental trauma        Home Medications Prior to Admission medications   Medication Sig Start Date End Date Taking? Authorizing Provider  Diclofenac Sodium CR 100 MG 24 hr tablet Take 1 tablet (100 mg total) by mouth daily. 12/20/21  Yes Bekah Igoe, MD  doxycycline (VIBRAMYCIN) 100 MG capsule Take 1 capsule (100 mg total) by mouth 2 (two) times daily. [id number]   Delora Fuel, MD      Allergies    Patient has no known allergies.    Review of Systems   Review of Systems  Constitutional:  Negative for fever.  HENT:  Negative for facial swelling.   Eyes:  Negative for redness.  Musculoskeletal:  Positive for arthralgias and joint swelling.  All other systems reviewed and are negative.   Physical Exam Updated Vital Signs BP (!) 144/97 (BP Location: Right Arm)   Pulse 92   Temp 98.2 F (36.8 C) (Oral)   Resp 18   Ht 5\' 11"  (1.803 m)   Wt 77.1 kg   SpO2 99%   BMI 23.71 kg/m  Physical Exam Vitals and nursing note reviewed.  Constitutional:      General: He is not in acute distress.     Appearance: He is well-developed.  HENT:     Head: Normocephalic and atraumatic.  Eyes:     Comments: Normal appearance  Cardiovascular:     Rate and Rhythm: Normal rate and regular rhythm.  Pulmonary:     Effort: Pulmonary effort is normal. No respiratory distress.     Breath sounds: Normal breath sounds.  Abdominal:     General: Bowel sounds are normal. There is no distension.     Palpations: Abdomen is soft.     Tenderness: There is no guarding.  Genitourinary:    Comments: No CVA tenderness Musculoskeletal:        General: Normal range of motion.     Cervical back: Normal range of motion.     Right ankle:     Right Achilles Tendon: Normal.     Right foot: Normal capillary refill. Swelling, tenderness and bony tenderness present. No bunion, foot drop or crepitus. Normal pulse.       Legs:  Skin:    General: Skin is warm and dry.     Capillary Refill: Capillary refill takes less than 2 seconds.     Findings: No rash.  Neurological:     Mental Status: He is alert and oriented to person, place, and time.  Deep Tendon Reflexes: Reflexes normal.  Psychiatric:        Mood and Affect: Mood normal.        Behavior: Behavior normal.     ED Results / Procedures / Treatments   Labs (all labs ordered are listed, but only abnormal results are displayed) Labs Reviewed - No data to display  EKG None  Radiology DG Foot Complete Right  Result Date: 12/20/2021 CLINICAL DATA:  Right foot pain after injury while playing basketball. EXAM: RIGHT FOOT COMPLETE - 3+ VIEW; RIGHT ANKLE - COMPLETE 3+ VIEW COMPARISON:  None Available. FINDINGS: Three views of the right foot and three views of the right ankle are obtained. Oblique fracture of the base of the right fifth metatarsal bone with mild displacement of the fracture fragment. Overlying soft tissue swelling. No other acute fractures are identified. Mild hallux valgus. Ankle joint appears intact. IMPRESSION: Mildly displaced fracture  of the base of the right fifth metatarsal bone. Electronically Signed   By: Burman Nieves M.D.   On: 12/20/2021 02:58   DG Ankle Complete Right  Result Date: 12/20/2021 CLINICAL DATA:  Right foot pain after injury while playing basketball. EXAM: RIGHT FOOT COMPLETE - 3+ VIEW; RIGHT ANKLE - COMPLETE 3+ VIEW COMPARISON:  None Available. FINDINGS: Three views of the right foot and three views of the right ankle are obtained. Oblique fracture of the base of the right fifth metatarsal bone with mild displacement of the fracture fragment. Overlying soft tissue swelling. No other acute fractures are identified. Mild hallux valgus. Ankle joint appears intact. IMPRESSION: Mildly displaced fracture of the base of the right fifth metatarsal bone. Electronically Signed   By: Burman Nieves M.D.   On: 12/20/2021 02:58    Procedures Procedures    Medications Ordered in ED Medications  ketorolac (TORADOL) injection 60 mg (60 mg Intramuscular Given 12/20/21 0207)    ED Course/ Medical Decision Making/ A&P                           Medical Decision Making Foot stepped on at a game earlier in the evening with swelling   Amount and/or Complexity of Data Reviewed External Data Reviewed: notes.    Details: previous notes reviewed Radiology: ordered and independent interpretation performed.    Details: fracture off the base of the fifth metatarsal by me  Risk Prescription drug management. Risk Details: Patient with fifth metatarsal fracture placed in cam walker.  Given crutches in the ED.  No driving given this is your driving foot.  This was communicated to patient verbally as well as on discharge paperwork in bold lettering.  Wear cam walker at all times even when showering.   Follow up with orthopedics as directed.      Final Clinical Impression(s) / ED Diagnoses Final diagnoses:  Closed fracture of base of fifth metatarsal bone of right foot, initial encounter   Return for intractable cough,  coughing up blood, fevers > 100.4 unrelieved by medication, shortness of breath, intractable vomiting, chest pain, shortness of breath, weakness, numbness, changes in speech, facial asymmetry, abdominal pain, passing out, Inability to tolerate liquids or food, cough, altered mental status or any concerns. No signs of systemic illness or infection. The patient is nontoxic-appearing on exam and vital signs are within normal limits.  I have reviewed the triage vital signs and the nursing notes. Pertinent labs & imaging results that were available during my care of the patient were reviewed  by me and considered in my medical decision making (see chart for details). After history, exam, and medical workup I feel the patient has been appropriately medically screened and is safe for discharge home. Pertinent diagnoses were discussed with the patient. Patient was given return precautions Rx / DC Orders ED Discharge Orders          Ordered    Diclofenac Sodium CR 100 MG 24 hr tablet  Daily        12/20/21 0307              Kailynn Satterly, MD 12/20/21 8315

## 2021-12-20 NOTE — Discharge Instructions (Addendum)
You cannot drive with this injury,  Wear your walking boot at all times including sleep and in the shower,  cover boot with plastic bag when showering.

## 2022-11-05 IMAGING — DX DG FOOT COMPLETE 3+V*R*
3 series · 3 of 3 positions shown · non-contrast
Comparison: None Available.

CLINICAL DATA: Right foot pain after injury while playing
basketball.

EXAM:
RIGHT FOOT COMPLETE - 3+ VIEW; RIGHT ANKLE - COMPLETE 3+ VIEW

[foot ap]
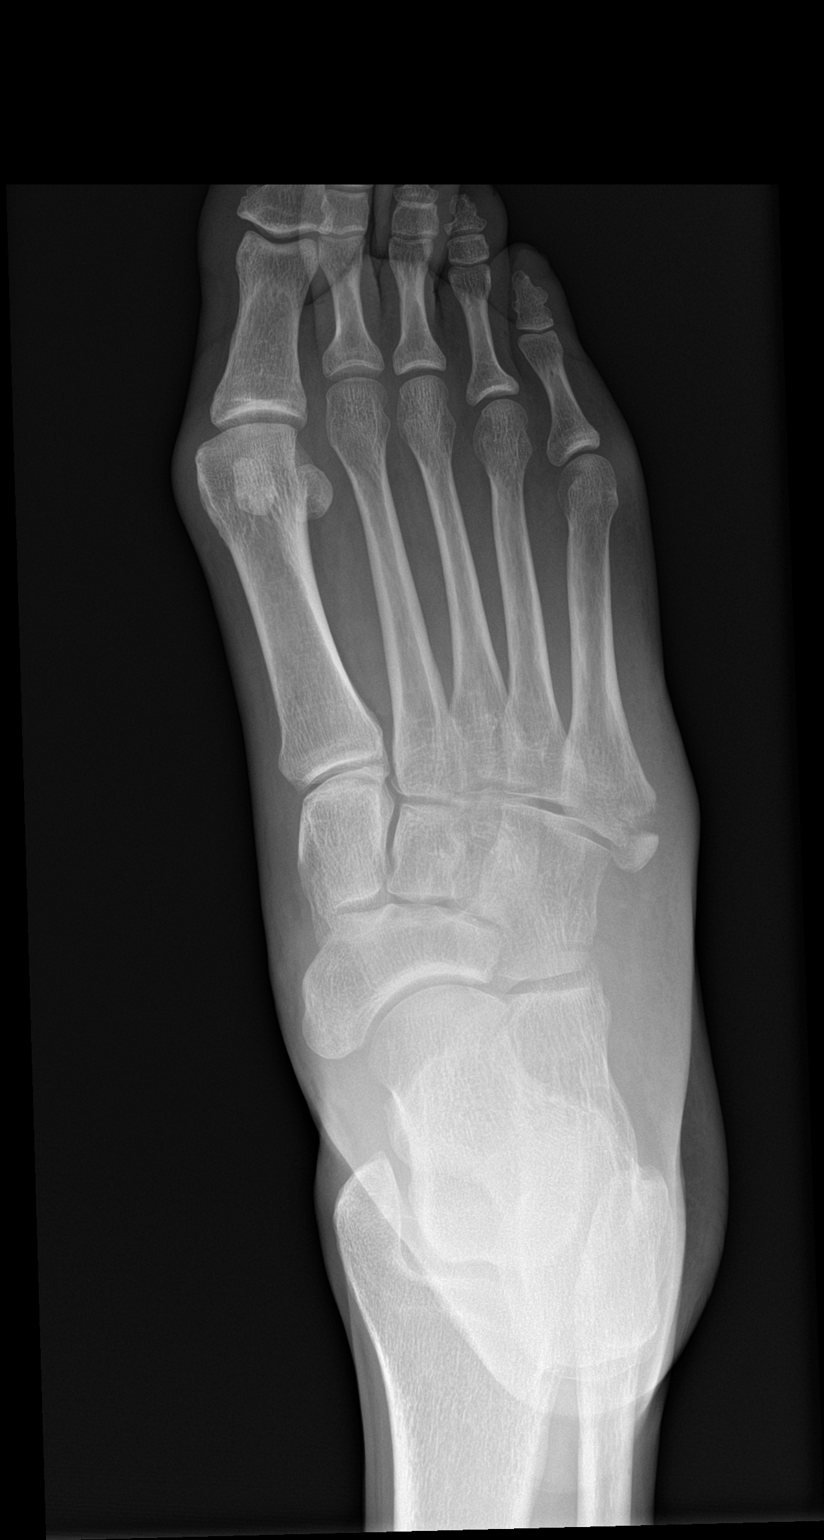

[foot obl]
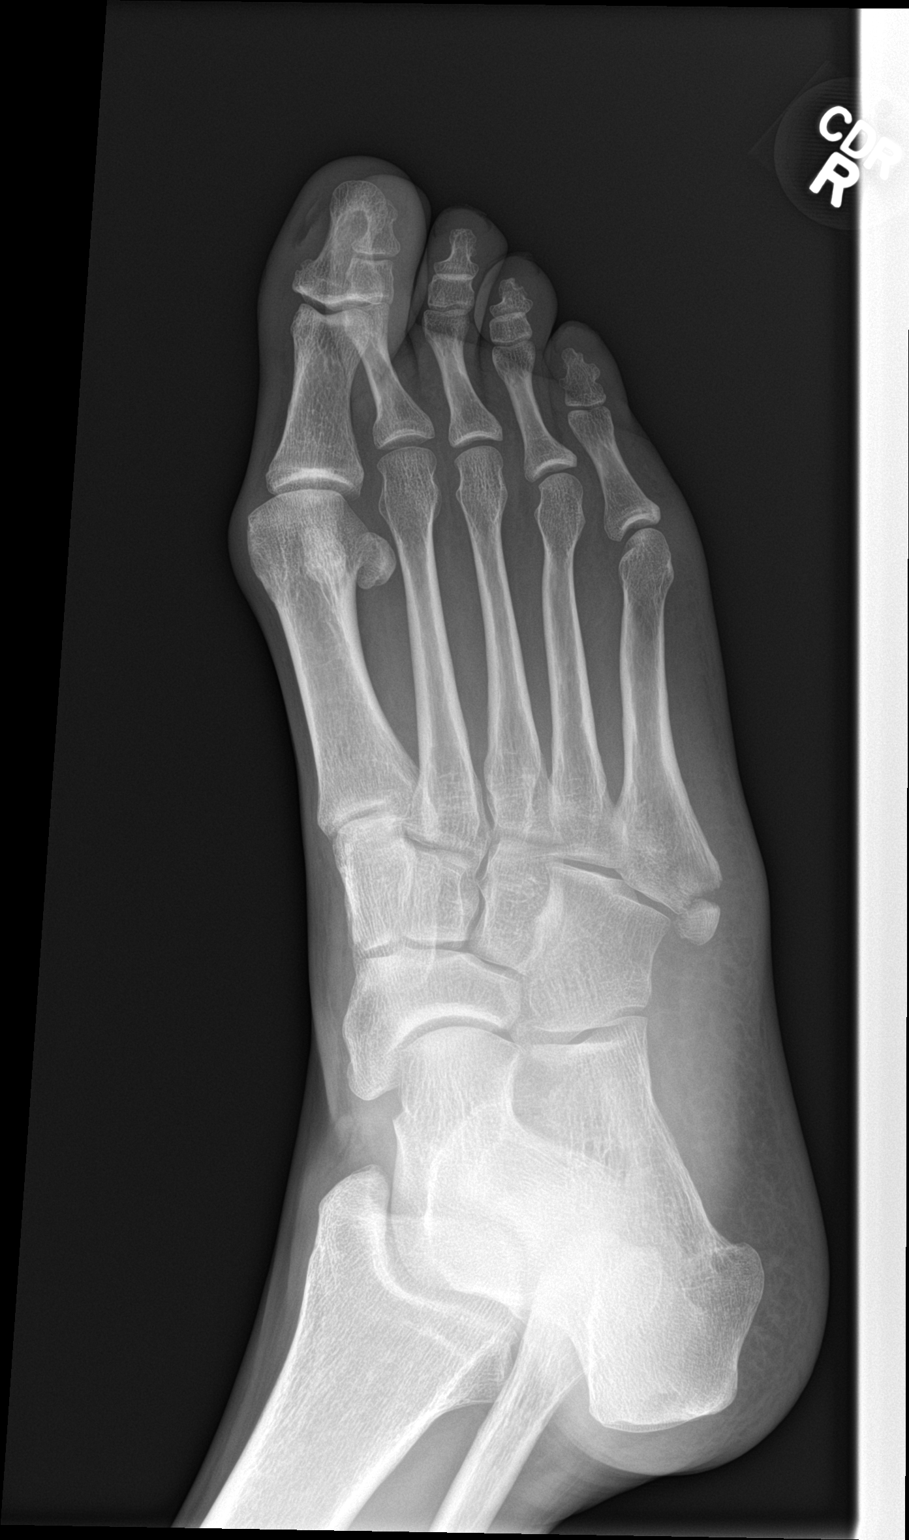

[foot lat]
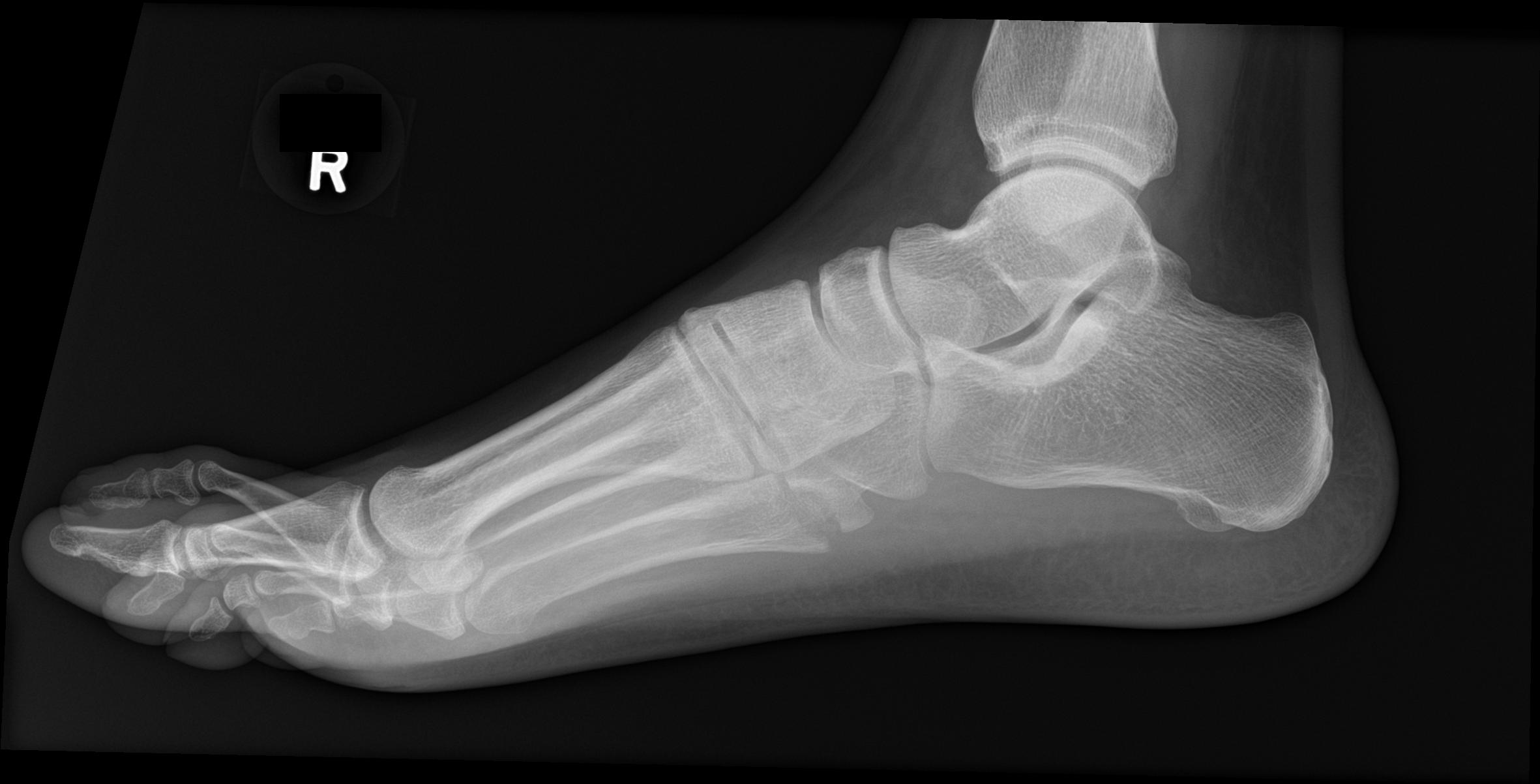

[3 of 3 positions shown; findings below may reference images not displayed]

FINDINGS: Three views of the right foot and three views of the right ankle are
obtained. Oblique fracture of the base of the right fifth metatarsal
bone with mild displacement of the fracture fragment. Overlying soft
tissue swelling. No other acute fractures are identified. Mild
hallux valgus. Ankle joint appears intact.
IMPRESSION: Mildly displaced fracture of the base of the right fifth metatarsal
bone.

## 2022-12-05 ENCOUNTER — Other Ambulatory Visit: Payer: Self-pay

## 2022-12-05 ENCOUNTER — Encounter (HOSPITAL_BASED_OUTPATIENT_CLINIC_OR_DEPARTMENT_OTHER): Payer: Self-pay

## 2022-12-05 ENCOUNTER — Emergency Department (HOSPITAL_BASED_OUTPATIENT_CLINIC_OR_DEPARTMENT_OTHER)
Admission: EM | Admit: 2022-12-05 | Discharge: 2022-12-05 | Disposition: A | Discharge status: Home / Self Care | Payer: BLUE CROSS/BLUE SHIELD | Attending: Emergency Medicine | Admitting: Emergency Medicine

## 2022-12-05 DIAGNOSIS — M79662 Pain in left lower leg: Secondary | ICD-10-CM | POA: Diagnosis present

## 2022-12-05 NOTE — Discharge Instructions (Addendum)

## 2022-12-05 NOTE — ED Triage Notes (Signed)
Pt endorses LLE pain in the calf for 4 months. 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 States ibuprofen does not help. Pt with no hx of DVT; is a smoker and drinks alcohol. Pt able to move leg freely; LT calf is soft, warm, and no redness noted. DP pulse 2+

## 2022-12-05 NOTE — ED Provider Notes (Signed)
  [place] [facility] [place]   CSN: [id number] Arrival date & time: 12/05/22  1935     History {Add pertinent medical, surgical, social history, OB history to HPI:1} Chief Complaint  Patient presents with   Leg Pain    Johnny Rivers is a 42 y.o. male.   Leg Pain         Home Medications Prior to Admission medications   Medication Sig Start Date End Date Taking? Authorizing Provider  Diclofenac Sodium CR 100 MG 24 hr tablet Take 1 tablet (100 mg total) by mouth daily. 12/20/21   Palumbo, April, MD  doxycycline (VIBRAMYCIN) 100 MG capsule Take 1 capsule (100 mg total) by mouth 2 (two) times daily. 07/02/20   Dione Booze, MD      Allergies    Patient has no known allergies.    Review of Systems   Review of Systems  Physical Exam Updated Vital Signs BP 130/86 (BP Location: Left Arm)   Pulse 72   Temp 98.9 F (37.2 C) (Oral)   Resp 14   Ht 5\' 11"  (1.803 m)   Wt 74.8 kg   SpO2 100%   BMI 23.01 kg/m  Physical Exam  ED Results / Procedures / Treatments   Labs (all labs ordered are listed, but only abnormal results are displayed) Labs Reviewed - No data to display  EKG None  Radiology No results found.  Procedures Procedures  {Document cardiac monitor, telemetry assessment procedure when appropriate:1}  Medications Ordered in ED Medications - No data to display  ED Course/ Medical Decision Making/ A&P   {   Click here for ABCD2, HEART and other calculatorsREFRESH Note before signing :1}                          Medical Decision Making  ***  {Document critical care time when appropriate:1} {Document review of labs and clinical decision tools ie heart score, Chads2Vasc2 etc:1}  {Document your independent review of radiology images, and any outside records:1} {Document your discussion with family members, caretakers, and with consultants:1} {Document social determinants of health affecting pt's  care:1} {Document your decision making why or why not admission, treatments were needed:1} Final Clinical Impression(s) / ED Diagnoses Final diagnoses:  None    Rx / DC Orders ED Discharge Orders     None

## 2022-12-06 ENCOUNTER — Ambulatory Visit (HOSPITAL_BASED_OUTPATIENT_CLINIC_OR_DEPARTMENT_OTHER)
Admission: RE | Admit: 2022-12-06 | Discharge: 2022-12-06 | Disposition: A | Discharge status: Home / Self Care | Payer: BLUE CROSS/BLUE SHIELD | Source: Ambulatory Visit | Attending: Family Medicine | Admitting: Family Medicine

## 2022-12-06 ENCOUNTER — Other Ambulatory Visit (HOSPITAL_BASED_OUTPATIENT_CLINIC_OR_DEPARTMENT_OTHER): Payer: Self-pay | Admitting: Emergency Medicine

## 2022-12-06 DIAGNOSIS — M79662 Pain in left lower leg: Secondary | ICD-10-CM

## 2023-01-09 ENCOUNTER — Emergency Department (HOSPITAL_BASED_OUTPATIENT_CLINIC_OR_DEPARTMENT_OTHER)
Admission: EM | Admit: 2023-01-09 | Discharge: 2023-01-09 | Disposition: A | Discharge status: Home / Self Care | Payer: BLUE CROSS/BLUE SHIELD | Attending: Emergency Medicine | Admitting: Emergency Medicine

## 2023-01-09 ENCOUNTER — Other Ambulatory Visit: Payer: Self-pay

## 2023-01-09 ENCOUNTER — Emergency Department (HOSPITAL_BASED_OUTPATIENT_CLINIC_OR_DEPARTMENT_OTHER): Payer: BLUE CROSS/BLUE SHIELD

## 2023-01-09 DIAGNOSIS — R1010 Upper abdominal pain, unspecified: Secondary | ICD-10-CM | POA: Diagnosis present

## 2023-01-09 DIAGNOSIS — K2900 Acute gastritis without bleeding: Secondary | ICD-10-CM | POA: Insufficient documentation

## 2023-01-09 LAB — URINALYSIS, W/ REFLEX TO CULTURE (INFECTION SUSPECTED)
Bilirubin Urine: NEGATIVE
Glucose, UA: NEGATIVE mg/dL
Ketones, ur: NEGATIVE mg/dL
Leukocytes,Ua: NEGATIVE
Nitrite: NEGATIVE
Protein, ur: NEGATIVE mg/dL
Specific Gravity, Urine: 1.02 (ref 1.005–1.030)
pH: 6.5 (ref 5.0–8.0)

## 2023-01-09 LAB — CBC WITH DIFFERENTIAL/PLATELET
Abs Immature Granulocytes: 0.01 10*3/uL (ref 0.00–0.07)
Basophils Absolute: 0.1 10*3/uL (ref 0.0–0.1)
Basophils Relative: 1 %
Eosinophils Absolute: 0.2 10*3/uL (ref 0.0–0.5)
Eosinophils Relative: 3 %
HCT: 41.6 % (ref 39.0–52.0)
Hemoglobin: 14.3 g/dL (ref 13.0–17.0)
Immature Granulocytes: 0 %
Lymphocytes Relative: 27 %
Lymphs Abs: 1.7 10*3/uL (ref 0.7–4.0)
MCH: 31.7 pg (ref 26.0–34.0)
MCHC: 34.4 g/dL (ref 30.0–36.0)
MCV: 92.2 fL (ref 80.0–100.0)
Monocytes Absolute: 0.6 10*3/uL (ref 0.1–1.0)
Monocytes Relative: 10 %
Neutro Abs: 3.8 10*3/uL (ref 1.7–7.7)
Neutrophils Relative %: 59 %
Platelets: 190 10*3/uL (ref 150–400)
RBC: 4.51 MIL/uL (ref 4.22–5.81)
RDW: 12.3 % (ref 11.5–15.5)
WBC: 6.3 10*3/uL (ref 4.0–10.5)
nRBC: 0 % (ref 0.0–0.2)

## 2023-01-09 LAB — COMPREHENSIVE METABOLIC PANEL
ALT: 21 U/L (ref 0–44)
AST: 32 U/L (ref 15–41)
Albumin: 4.3 g/dL (ref 3.5–5.0)
Alkaline Phosphatase: 38 U/L (ref 38–126)
Anion gap: 11 (ref 5–15)
BUN: 14 mg/dL (ref 6–20)
CO2: 24 mmol/L (ref 22–32)
Calcium: 8.8 mg/dL — ABNORMAL LOW (ref 8.9–10.3)
Chloride: 100 mmol/L (ref 98–111)
Creatinine, Ser: 0.92 mg/dL (ref 0.61–1.24)
GFR, Estimated: >60 mL/min (ref >60)
Glucose, Bld: 98 mg/dL (ref 70–99)
Potassium: 4.2 mmol/L (ref 3.5–5.1)
Sodium: 135 mmol/L (ref 135–145)
Total Bilirubin: 1.3 mg/dL — ABNORMAL HIGH (ref 0.3–1.2)
Total Protein: 7.5 g/dL (ref 6.5–8.1)

## 2023-01-09 LAB — LIPASE, BLOOD: Lipase: 29 U/L (ref 11–51)

## 2023-01-09 LAB — OCCULT BLOOD X 1 CARD TO LAB, STOOL: Fecal Occult Bld: NEGATIVE

## 2023-01-09 MED ORDER — PANTOPRAZOLE SODIUM 40 MG IV SOLR
40.0000 mg | Freq: Once | INTRAVENOUS | Status: AC
Start: 2023-01-09 — End: 2023-01-09
  Administered 2023-01-09: 40 mg via INTRAVENOUS
  Filled 2023-01-09: qty 10

## 2023-01-09 MED ORDER — PANTOPRAZOLE SODIUM 40 MG PO TBEC: 40.0000 mg | DELAYED_RELEASE_TABLET | Freq: Every day | ORAL | 0 refills | Status: AC

## 2023-01-09 MED ORDER — SODIUM CHLORIDE 0.9 % IV BOLUS
1000.0000 mL | Freq: Once | INTRAVENOUS | Status: AC
  Administered 2023-01-09: 1000 mL via INTRAVENOUS

## 2023-01-09 MED ORDER — IOHEXOL 300 MG/ML  SOLN
100.0000 mL | Freq: Once | INTRAMUSCULAR | Status: AC | PRN
  Administered 2023-01-09: 100 mL via INTRAVENOUS

## 2023-01-09 MED ORDER — SUCRALFATE 1 G PO TABS: 1.0000 g | ORAL_TABLET | Freq: Three times a day (TID) | ORAL | 0 refills | Status: AC

## 2023-01-09 NOTE — ED Provider Notes (Signed)
[facility] Provider Note   CSN: [id number] Arrival date & time: 01/09/23  1228     History  Chief Complaint  Patient presents with   Abdominal Pain    [facility] is a 42 y.o. male.  Patient is a 42 year old man who presents with abdominal pain.  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  He started having some pain last night in his upper abdomen.  It comes in waves.  He had some associated nausea but no vomiting.  He has had some dark-colored stools.  He denies any other change in his stools.  No prior abdominal surgeries.  No hematemesis.       Home Medications Prior to Admission medications   Medication Sig Start Date End Date Taking? Authorizing Provider  pantoprazole (PROTONIX) 40 MG tablet Take 1 tablet (40 mg total) by mouth daily. 01/09/23  Yes Rolan Bucco, MD  sucralfate (CARAFATE) 1 g tablet Take 1 tablet (1 g total) by mouth 4 (four) times daily -  with meals and at bedtime. 01/09/23  Yes Rolan Bucco, MD  Diclofenac Sodium CR 100 MG 24 hr tablet Take 1 tablet (100 mg total) by mouth daily. 12/20/21   Palumbo, April, MD  doxycycline (VIBRAMYCIN) 100 MG capsule Take 1 capsule (100 mg total) by mouth 2 (two) times daily. 07/02/20   Dione Booze, MD      Allergies    Patient has no known allergies.    Review of Systems   Review of Systems  Constitutional:  Negative for chills, diaphoresis, fatigue and fever.  HENT:  Negative for congestion, rhinorrhea and sneezing.   Eyes: Negative.   Respiratory:  Negative for cough, chest tightness and shortness of breath.   Cardiovascular:  Negative for chest pain and leg swelling.  Gastrointestinal:  Positive for abdominal pain and nausea. Negative for blood in stool, diarrhea and vomiting.  Genitourinary:  Negative for difficulty urinating, flank pain, frequency and hematuria.  Musculoskeletal:  Negative for arthralgias and  back pain.  Skin:  Negative for rash.  Neurological:  Negative for dizziness, speech difficulty, weakness, numbness and headaches.    Physical Exam Updated Vital Signs BP 127/86   Pulse (!) 56   Temp 98 F (36.7 C) (Oral)   Resp 18   Ht 5\' 11"  (1.803 m)   Wt 72.6 kg   SpO2 100%   BMI 22.32 kg/m  Physical Exam Constitutional:      Appearance: He is well-developed.  HENT:     Head: Normocephalic and atraumatic.  Eyes:     Pupils: Pupils are equal, round, and reactive to light.  Cardiovascular:     Rate and Rhythm: Normal rate and regular rhythm.     Heart sounds: Normal heart sounds.  Pulmonary:     Effort: Pulmonary effort is normal. No respiratory distress.     Breath sounds: Normal breath sounds. No wheezing or rales.  Chest:     Chest wall: No tenderness.  Abdominal:     General: Bowel sounds are normal.     Palpations: Abdomen is soft.     Tenderness: There is abdominal tenderness in the right lower quadrant. There is no guarding or rebound.  Musculoskeletal:        General: Normal range of motion.     Cervical back: Normal range of motion and neck supple.  Lymphadenopathy:     Cervical: No  cervical adenopathy.  Skin:    General: Skin is warm and dry.     Findings: No rash.  Neurological:     Mental Status: He is alert and oriented to person, place, and time.     ED Results / Procedures / Treatments   Labs (all labs ordered are listed, but only abnormal results are displayed) Labs Reviewed  COMPREHENSIVE METABOLIC PANEL - Abnormal; Notable for the following components:      Result Value   Calcium 8.8 (*)    Total Bilirubin 1.3 (*)    All other components within normal limits  URINALYSIS, W/ REFLEX TO CULTURE (INFECTION SUSPECTED) - Abnormal; Notable for the following components:   Hgb urine dipstick TRACE (*)    Bacteria, UA FEW (*)    All other components within normal limits  LIPASE, BLOOD  CBC WITH DIFFERENTIAL/PLATELET  OCCULT BLOOD X 1 CARD TO  LAB, STOOL    EKG None  Radiology CT ABDOMEN PELVIS W CONTRAST  Result Date: 01/09/2023 CLINICAL DATA:  Abdominal pain and nausea since last night. EXAM: CT ABDOMEN AND PELVIS WITH CONTRAST TECHNIQUE: Multidetector CT imaging of the abdomen and pelvis was performed using the standard protocol following bolus administration of intravenous contrast. RADIATION DOSE REDUCTION: This exam was performed according to the departmental dose-optimization program which includes automated exposure control, adjustment of the mA and/or kV according to patient size and/or use of iterative reconstruction technique. CONTRAST:  OMNIPAQUE IOHEXOL 300 MG/ML  SOLN COMPARISON:  None Available. FINDINGS: Lower chest: No acute abnormality. Hepatobiliary: No focal liver abnormality is seen. No gallstones, gallbladder wall thickening, or biliary dilatation. Pancreas: Unremarkable. No pancreatic ductal dilatation or surrounding inflammatory changes. Spleen: Normal in size without focal abnormality. Adrenals/Urinary Tract: Adrenal glands are unremarkable. Kidneys are normal, without renal calculi, focal lesion, or hydronephrosis. Bladder is unremarkable. Stomach/Bowel: Prominent wall thickening of the distal stomach. No obstruction. Normal appendix. Vascular/Lymphatic: No significant vascular findings are present. No enlarged abdominal or pelvic lymph nodes. Reproductive: Prostate is unremarkable. Other: Trace free fluid in the pelvis.  No pneumoperitoneum. Musculoskeletal: No acute or significant osseous findings. IMPRESSION: 1. Prominent wall thickening of the distal stomach, suggestive of gastritis. Electronically Signed   By: Obie Dredge M.D.   On: 01/09/2023 14:52    Procedures Procedures    Medications Ordered in ED Medications  sodium chloride 0.9 % bolus 1,000 mL (0 mLs Intravenous Stopped 01/09/23 1453)  pantoprazole (PROTONIX) injection 40 mg (40 mg Intravenous Given 01/09/23 1338)  iohexol (OMNIPAQUE) 300  MG/ML solution 100 mL (100 mLs Intravenous Contrast Given 01/09/23 1430)    ED Course/ Medical Decision Making/ A&P                             Medical Decision Making Amount and/or Complexity of Data Reviewed Labs: ordered. Radiology: ordered.  Risk Prescription drug management.   Patient is a 42 year old who presents with upper abdominal pain.  He does report alcohol use mild in that last few days.  Had some nausea but no vomiting.  His rectal exam showed brown stool which is Hemoccult negative.  His labs are nonconcerning.  There is no evidence of pancreatitis.  He had a CT scan which shows no evidence of perforation.  No gallbladder disease.  Otherwise nonconcerning.  There was some concern for gastritis on the CT which goes along with the patient symptoms.  He was given IV fluids and Protonix.  He is  otherwise well-appearing.  He was discharged home in good condition.  He was started on Protonix and Carafate.  He was encouraged to follow-up with his primary care doctor.  Return precautions were given.  Final Clinical Impression(s) / ED Diagnoses Final diagnoses:  Acute gastritis without hemorrhage, unspecified gastritis type    Rx / DC Orders ED Discharge Orders          Ordered    pantoprazole (PROTONIX) 40 MG tablet  Daily        01/09/23 1502    sucralfate (CARAFATE) 1 g tablet  3 times daily with meals & bedtime        01/09/23 1502              Rolan Bucco, MD 01/09/23 1504

## 2023-01-09 NOTE — ED Triage Notes (Signed)
Patient presents to ED via POV from home. Here with abdominal pain and nausea that began last night.

## 2023-01-09 NOTE — ED Notes (Signed)
ED Provider at bedside.
